# Patient Record
Sex: Male | Born: 1950 | Race: Black or African American | Hispanic: No | Smoking: Never smoker
Health system: Southern US, Community
[De-identification: ages and names within clinical notes are randomized; demographics above are authoritative.]

## PROBLEM LIST (undated history)

## (undated) DIAGNOSIS — D704 Cyclic neutropenia: Secondary | ICD-10-CM

## (undated) DIAGNOSIS — J45909 Unspecified asthma, uncomplicated: Secondary | ICD-10-CM

---

## 2016-03-23 ENCOUNTER — Emergency Department (HOSPITAL_COMMUNITY): Payer: Medicare Other

## 2016-03-23 ENCOUNTER — Emergency Department (HOSPITAL_COMMUNITY)
Admission: EM | Admit: 2016-03-23 | Discharge: 2016-03-24 | Disposition: A | Discharge status: Home / Self Care | Payer: Medicare Other | Attending: Emergency Medicine | Admitting: Emergency Medicine

## 2016-03-23 ENCOUNTER — Encounter (HOSPITAL_COMMUNITY): Payer: Self-pay

## 2016-03-23 DIAGNOSIS — J40 Bronchitis, not specified as acute or chronic: Secondary | ICD-10-CM | POA: Diagnosis not present

## 2016-03-23 DIAGNOSIS — R05 Cough: Secondary | ICD-10-CM | POA: Diagnosis present

## 2016-03-23 HISTORY — DX: Cyclic neutropenia: D70.4

## 2016-03-23 HISTORY — DX: Unspecified asthma, uncomplicated: J45.909

## 2016-03-23 LAB — COMPREHENSIVE METABOLIC PANEL
ALBUMIN: 3.8 g/dL (ref 3.5–5.0)
ALK PHOS: 62 U/L (ref 38–126)
ALT: 49 U/L (ref 17–63)
ANION GAP: 8 (ref 5–15)
AST: 49 U/L — ABNORMAL HIGH (ref 15–41)
BILIRUBIN TOTAL: 1.4 mg/dL — AB (ref 0.3–1.2)
BUN: 16 mg/dL (ref 6–20)
CO2: 25 mmol/L (ref 22–32)
Calcium: 8.7 mg/dL — ABNORMAL LOW (ref 8.9–10.3)
Chloride: 100 mmol/L — ABNORMAL LOW (ref 101–111)
Creatinine, Ser: 1.28 mg/dL — ABNORMAL HIGH (ref 0.61–1.24)
GFR calc Af Amer: >60 mL/min (ref >60)
GFR calc non Af Amer: 57 mL/min — ABNORMAL LOW (ref >60)
GLUCOSE: 110 mg/dL — AB (ref 65–99)
Potassium: 4.6 mmol/L (ref 3.5–5.1)
Sodium: 133 mmol/L — ABNORMAL LOW (ref 135–145)
Total Protein: 6.3 g/dL — ABNORMAL LOW (ref 6.5–8.1)

## 2016-03-23 LAB — CBC WITH DIFFERENTIAL/PLATELET
BASOS PCT: 0 %
Basophils Absolute: 0 10*3/uL (ref 0.0–0.1)
Eosinophils Absolute: 0 10*3/uL (ref 0.0–0.7)
Eosinophils Relative: 1 %
HEMATOCRIT: 43.7 % (ref 39.0–52.0)
HEMOGLOBIN: 15.1 g/dL (ref 13.0–17.0)
LYMPHS ABS: 0.8 10*3/uL (ref 0.7–4.0)
LYMPHS PCT: 19 %
MCH: 31.6 pg (ref 26.0–34.0)
MCHC: 34.6 g/dL (ref 30.0–36.0)
MCV: 91.4 fL (ref 78.0–100.0)
MONOS PCT: 16 %
Monocytes Absolute: 0.7 10*3/uL (ref 0.1–1.0)
NEUTROS ABS: 2.7 10*3/uL (ref 1.7–7.7)
NEUTROS PCT: 64 %
Platelets: 111 10*3/uL — ABNORMAL LOW (ref 150–400)
RBC: 4.78 MIL/uL (ref 4.22–5.81)
RDW: 14.3 % (ref 11.5–15.5)
WBC: 4.2 10*3/uL (ref 4.0–10.5)

## 2016-03-23 LAB — D-DIMER, QUANTITATIVE: D-Dimer, Quant: 0.32 ug/mL-FEU (ref 0.00–0.50)

## 2016-03-23 MED ORDER — ACETAMINOPHEN 325 MG PO TABS
650.0000 mg | ORAL_TABLET | Freq: Once | ORAL | Status: AC | PRN
  Administered 2016-03-23: 650 mg via ORAL

## 2016-03-23 MED ORDER — ACETAMINOPHEN 325 MG PO TABS
ORAL_TABLET | ORAL | Status: AC
  Filled 2016-03-23: qty 2

## 2016-03-23 NOTE — ED Triage Notes (Addendum)
Pt endorses cold sx, productive cough with yellow sputum, chills x 5 days. Pt has taken "airborne and dayquill". Pt has hx of cyclic neutropenia and was told "If I ever get sick to go to the hospital" Temp 101.7 oral.

## 2016-03-23 NOTE — ED Notes (Signed)
Pt called for triage and no answer x3

## 2016-03-23 NOTE — ED Notes (Signed)
Patient ambulatory to restroom with steady gait.

## 2016-03-24 DIAGNOSIS — J40 Bronchitis, not specified as acute or chronic: Secondary | ICD-10-CM | POA: Diagnosis not present

## 2016-03-24 MED ORDER — LEVOFLOXACIN 500 MG PO TABS
500.0000 mg | ORAL_TABLET | Freq: Once | ORAL | Status: AC
  Administered 2016-03-24: 500 mg via ORAL
  Filled 2016-03-24: qty 1

## 2016-03-24 MED ORDER — LEVOFLOXACIN 500 MG PO TABS: 500.0000 mg | ORAL_TABLET | Freq: Every day | ORAL | 0 refills | Status: AC

## 2016-03-24 NOTE — ED Provider Notes (Signed)
[facility]-EMERGENCY DEPT Provider Note   CSN: [id number] Arrival date & time: 03/23/16  1955     History   Chief Complaint Chief Complaint  Patient presents with  . URI    HPI Dylan Perry is a 66 y.o. male.  The history is provided by the patient. No language interpreter was used.  URI   This is a new problem. The problem has been rapidly worsening. There has been no fever. Pertinent negatives include no chest pain. He has tried nothing for the symptoms. The treatment provided no relief.  Pt complains of a cough for the past 5 days.  Pt reports he has had a fever and chills.  Pt reports she has a history of cyclic neutropenia. Pt reports he is living in [place] and flew here because his father fell and broke a hip. Pt has been in the hospital and the rehab facility.  He did not have a flu shot.   Past Medical History:  Diagnosis Date  . Asthma   . Cyclic neutropenia (HCC)     There are no active problems to display for this patient.   History reviewed. No pertinent surgical history.     Home Medications    Prior to Admission medications   Medication Sig Start Date End Date Taking? Authorizing Provider  albuterol (PROVENTIL HFA;VENTOLIN HFA) 108 (90 Base) MCG/ACT inhaler Inhale 1 puff into the lungs every 6 (six) hours as needed for wheezing or shortness of breath.   Yes Historical Provider, MD  lisinopril (PRINIVIL,ZESTRIL) 20 MG tablet Take 20 mg by mouth daily.   Yes Historical Provider, MD  tamsulosin (FLOMAX) 0.4 MG CAPS capsule Take 0.4 mg by mouth daily after supper.   Yes Historical Provider, MD    Family History History reviewed. No pertinent family history.  Social History Social History  Substance Use Topics  . Smoking status: Never Smoker  . Smokeless tobacco: Never Used  . Alcohol use Yes     Comment: occ     Allergies   Patient has no known allergies.   Review of Systems Review of Systems  Cardiovascular: Negative for chest pain.  All other  systems reviewed and are negative.    Physical Exam Updated Vital Signs BP 139/99   Pulse 83   Temp 99.4 F (37.4 C) (Oral)   Resp 18   Ht 5\' 9"  (1.753 m)   Wt 90.7 kg   SpO2 97%   BMI 29.53 kg/m   Physical Exam  Constitutional: He appears well-developed.  HENT:  Head: Normocephalic.  Eyes: Pupils are equal, round, and reactive to light.  Neck: Normal range of motion.  Cardiovascular: Normal rate and regular rhythm.   Pulmonary/Chest: Effort normal.  Musculoskeletal: Normal range of motion.  Neurological: He is alert.  Skin: Skin is warm.  Psychiatric: He has a normal mood and affect.  Nursing note and vitals reviewed.    ED Treatments / Results  Labs (all labs ordered are listed, but only abnormal results are displayed) Labs Reviewed  CBC WITH DIFFERENTIAL/PLATELET - Abnormal; Notable for the following:       Result Value   Platelets 111 (*)    All other components within normal limits  COMPREHENSIVE METABOLIC PANEL - Abnormal; Notable for the following:    Sodium 133 (*)    Chloride 100 (*)    Glucose, Bld 110 (*)    Creatinine, Ser 1.28 (*)    Calcium 8.7 (*)    Total Protein 6.3 (*)  AST 49 (*)    Total Bilirubin 1.4 (*)    GFR calc non Af Amer 57 (*)    All other components within normal limits  D-DIMER, QUANTITATIVE (NOT AT [facility])    EKG  EKG Interpretation None       Radiology Dg Chest 2 View  Result Date: 03/23/2016 CLINICAL DATA:  Productive cough and shortness of breath.  Fever. EXAM: CHEST  2 VIEW COMPARISON:  None. FINDINGS: The lungs are hyperinflated. Cardiomediastinal contours are normal. No focal airspace consolidation or pulmonary edema. No pneumothorax or pleural effusion. IMPRESSION: Hyperexpanded lungs, suggesting COPD.  No focal consolidation. Electronically Signed   By: Deatra Robinson M.D.   On: 03/23/2016 20:32    Procedures Procedures (including critical care time)  Medications Ordered in ED Medications  acetaminophen  (TYLENOL) tablet 650 mg (650 mg Oral Given 03/23/16 2012)     Initial Impression / Assessment and Plan / ED Course  I have reviewed the triage vital signs and the nursing notes.  Pertinent labs & imaging results that were available during my care of the patient were reviewed by me and considered in my medical decision making (see chart for details).    Dr. Preston Fleeting in to see.  I will treat with levaquin.   Pt advised to return if any problems.   Final Clinical Impressions(s) / ED Diagnoses   Final diagnoses:  Bronchitis    New Prescriptions New Prescriptions   LEVOFLOXACIN (LEVAQUIN) 500 MG TABLET    Take 1 tablet (500 mg total) by mouth daily.     Elson Areas, PA-C 03/24/16 0035    Elson Areas, PA-C 03/24/16 [name]    Dione Booze, MD 03/24/16 [phone]

## 2016-03-24 NOTE — ED Notes (Signed)
Patient verbalized understanding of discharge instructions and denies any further needs or questions at this time. VS stable. Patient ambulatory with steady gait.  

## 2018-10-22 IMAGING — DX DG CHEST 2V
2 series · 2 of 2 positions shown · non-contrast
Comparison: None.

CLINICAL DATA: Productive cough and shortness of breath.  Fever.

EXAM:
CHEST  2 VIEW

[chest pa]
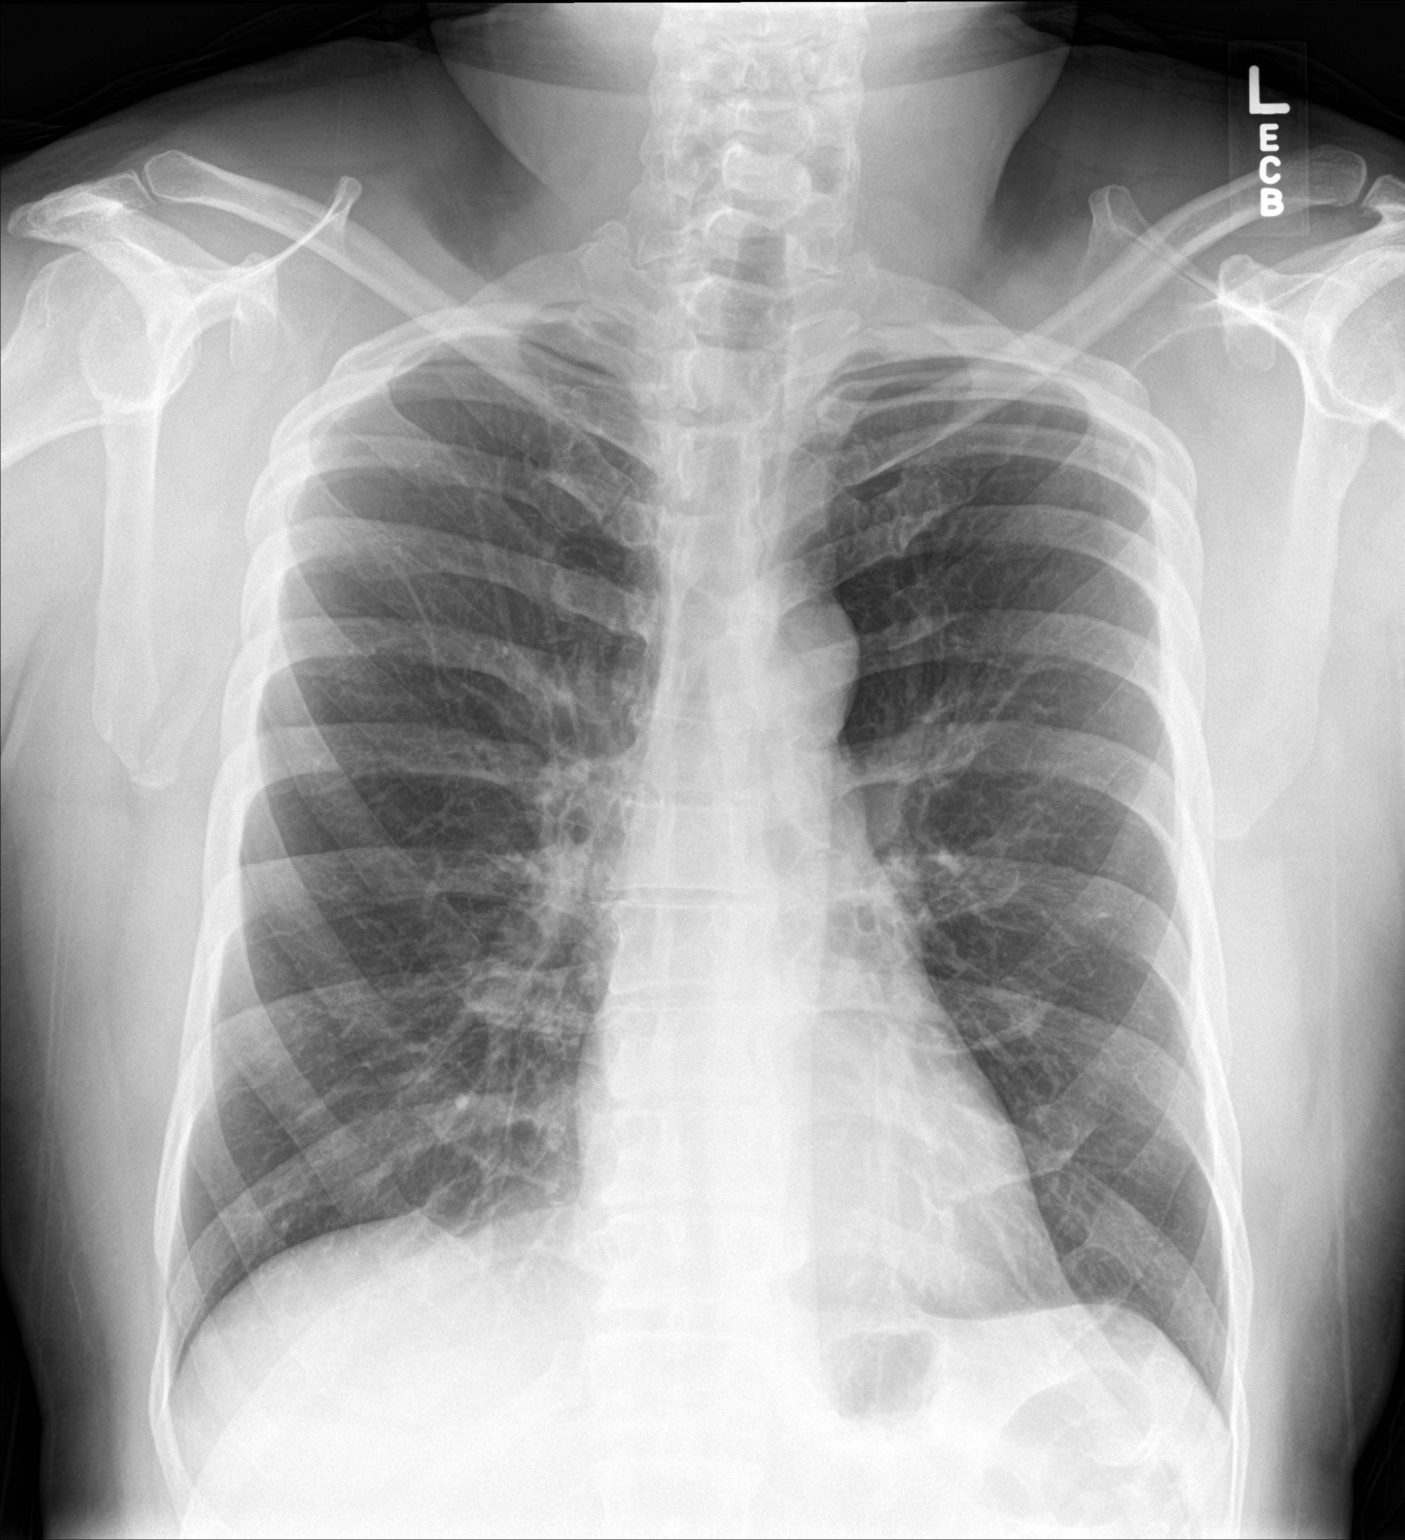

[chest lat]
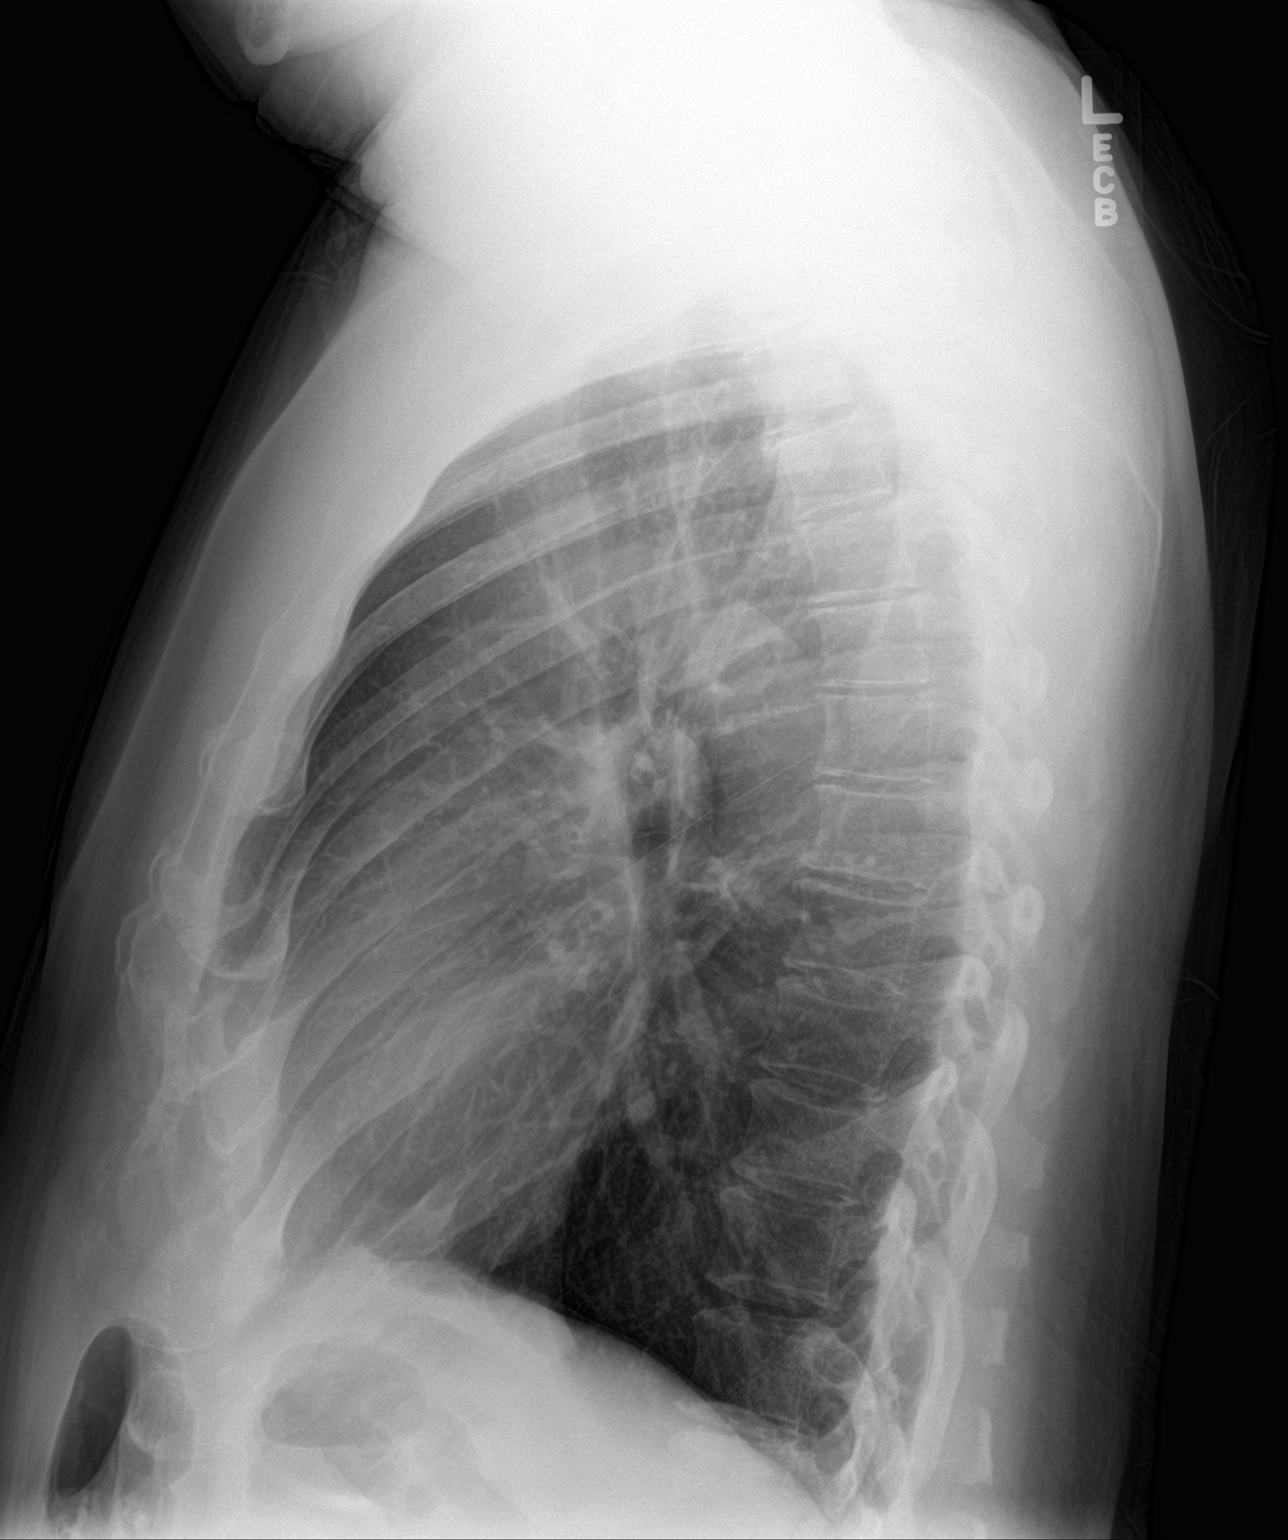

[2 of 2 positions shown; findings below may reference images not displayed]

FINDINGS: The lungs are hyperinflated. Cardiomediastinal contours are normal.
No focal airspace consolidation or pulmonary edema. No pneumothorax
or pleural effusion.
IMPRESSION: Hyperexpanded lungs, suggesting COPD.  No focal consolidation.
